# Patient Record
Sex: Female | Born: 1990 | Race: Black or African American | Hispanic: No | Marital: Single | Smoking: Current every day smoker
Health system: Southern US, Community
[De-identification: ages and names within clinical notes are randomized; demographics above are authoritative.]

## PROBLEM LIST (undated history)

## (undated) DIAGNOSIS — E669 Obesity, unspecified: Secondary | ICD-10-CM

## (undated) DIAGNOSIS — F1910 Other psychoactive substance abuse, uncomplicated: Secondary | ICD-10-CM

## (undated) HISTORY — DX: Other psychoactive substance abuse, uncomplicated: F19.10

---
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
## 2016-12-14 LAB — HM PAP SMEAR: HM Pap smear: NEGATIVE

## 2017-01-09 ENCOUNTER — Emergency Department
Admission: EM | Admit: 2017-01-09 | Discharge: 2017-01-09 | Disposition: A | Discharge status: Home / Self Care | Payer: Medicaid Other | Attending: Emergency Medicine | Admitting: Emergency Medicine

## 2017-01-09 ENCOUNTER — Encounter: Payer: Self-pay | Admitting: Emergency Medicine

## 2017-01-09 DIAGNOSIS — J039 Acute tonsillitis, unspecified: Secondary | ICD-10-CM | POA: Diagnosis not present

## 2017-01-09 DIAGNOSIS — H1131 Conjunctival hemorrhage, right eye: Secondary | ICD-10-CM | POA: Insufficient documentation

## 2017-01-09 DIAGNOSIS — F17210 Nicotine dependence, cigarettes, uncomplicated: Secondary | ICD-10-CM | POA: Diagnosis not present

## 2017-01-09 DIAGNOSIS — J029 Acute pharyngitis, unspecified: Secondary | ICD-10-CM | POA: Diagnosis present

## 2017-01-09 LAB — POCT RAPID STREP A: Streptococcus, Group A Screen (Direct): NEGATIVE

## 2017-01-09 MED ORDER — DIPHENHYDRAMINE HCL 25 MG PO CAPS: 25.0000 mg | ORAL_CAPSULE | Freq: Three times a day (TID) | ORAL | 0 refills | Status: DC | PRN

## 2017-01-09 MED ORDER — AMOXICILLIN 500 MG PO CAPS: 500.0000 mg | ORAL_CAPSULE | Freq: Three times a day (TID) | ORAL | 0 refills | Status: DC

## 2017-01-09 NOTE — ED Provider Notes (Signed)
[facility] Emergency Department Provider Note   ____________________________________________   None    (approximate)  I have reviewed the triage vital signs and the nursing notes.   HISTORY  Chief Complaint Sore Throat    HPI Bonnie Rasmussen is a 26 y.o. female patient complaining of sore throat for 3-4 days. Patient state pain with swallowing. Patient also states productive cough with green and yellow mucus. Patient is 12 weeks' gestation. Patient has a history of hypertrophic tonsils and adenoids. Patient rates the pain as a 10 over 10. No palliative measures taken for her complaint. History reviewed. No pertinent past medical history.  There are no active problems to display for this patient.   History reviewed. No pertinent surgical history.  Prior to Admission medications   Medication Sig Start Date End Date Taking? Authorizing Provider  amoxicillin (AMOXIL) 500 MG capsule Take 1 capsule (500 mg total) by mouth 3 (three) times daily. 01/09/17   Joni ReiningSmith, Ronald K, PA-C  diphenhydrAMINE (BENADRYL) 25 mg capsule Take 1 capsule (25 mg total) by mouth every 8 (eight) hours as needed. 01/09/17 01/09/18  Joni ReiningSmith, Ronald K, PA-C    Allergies Patient has no known allergies.  No family history on file.  Social History Social History  Substance Use Topics  . Smoking status: Current Every Day Smoker    Packs/day: 0.20    Types: Cigarettes  . Smokeless tobacco: Never Used  . Alcohol use No    Review of Systems Constitutional: No fever/chills Eyes: No visual changes. ENT: Sore throat  Cardiovascular: Denies chest pain. Respiratory: Denies shortness of breath. Productive cough Gastrointestinal: No abdominal pain.  No nausea, no vomiting.  No diarrhea.  No constipation. Genitourinary: Negative for dysuria. Musculoskeletal: Negative for back pain. Skin: Negative for rash. Neurological: Negative for headaches, focal weakness or  numbness.   ____________________________________________   PHYSICAL EXAM:  VITAL SIGNS: ED Triage Vitals  Enc Vitals Group     BP 01/09/17 1029 128/76     Pulse Rate 01/09/17 1029 87     Resp 01/09/17 1029 18     Temp 01/09/17 1029 98.4 F (36.9 C)     Temp Source 01/09/17 1029 Oral     SpO2 01/09/17 1029 96 %     Weight 01/09/17 1017 282 lb (127.9 kg)     Height 01/09/17 1017 5\' 5"  (1.651 m)     Head Circumference --      Peak Flow --      Pain Score 01/09/17 1017 10     Pain Loc --      Pain Edu? --      Excl. in GC? --     Constitutional: Alert and oriented. Well appearing and in no acute distress. Eyes: Redness right eye. PERRL. EOMI. Head: Atraumatic. Nose: No congestion/rhinnorhea. Mouth/Throat: Mucous membranes are moist.  Oropharynx non-erythematous. Edematous bilateral tonsils with exudate. Neck: No stridor.  No cervical spine tenderness to palpation.* Hematological/Lymphatic/Immunilogical: Bilateral cervical lymphadenopathy. Cardiovascular: Normal rate, regular rhythm. Grossly normal heart sounds.  Good peripheral circulation. Respiratory: Normal respiratory effort.  No retractions. Lungs CTAB. Neurologic:  Normal speech and language. No gross focal neurologic deficits are appreciated. No gait instability. Skin:  Skin is warm, dry and intact. No rash noted. Psychiatric: Mood and affect are normal. Speech and behavior are normal.  ____________________________________________   LABS (all labs ordered are listed, but only abnormal results are displayed)  Labs Reviewed  POCT RAPID STREP A   ____________________________________________  EKG  ____________________________________________  RADIOLOGY  No results found.  ____________________________________________   PROCEDURES  Procedure(s) performed: None  Procedures  Critical Care performed: No  ____________________________________________   INITIAL IMPRESSION / ASSESSMENT AND PLAN / ED  COURSE  Pertinent labs & imaging results that were available during my care of the patient were reviewed by me and considered in my medical decision making (see chart for details).  Tonsillitis and conjunctiva hemorrhaging of the right. She given discharge care instructions and advised follow-up PCP if no improvement 3-5 days.      ____________________________________________   FINAL CLINICAL IMPRESSION(S) / ED DIAGNOSES  Final diagnoses:  Tonsillitis  Conjunctival hemorrhage of right eye      NEW MEDICATIONS STARTED DURING THIS VISIT:  Discharge Medication List as of 01/09/2017 11:42 AM    START taking these medications   Details  amoxicillin (AMOXIL) 500 MG capsule Take 1 capsule (500 mg total) by mouth 3 (three) times daily., Starting Mon 01/09/2017, Print    diphenhydrAMINE (BENADRYL) 25 mg capsule Take 1 capsule (25 mg total) by mouth every 8 (eight) hours as needed., Starting Mon 01/09/2017, Until Tue 01/09/2018, Print         Note:  This document was prepared using Dragon voice recognition software and may include unintentional dictation errors.    Joni Reining, PA-C 01/09/17 1144    Rockne Menghini, MD 01/09/17 [phone]

## 2017-01-09 NOTE — ED Triage Notes (Signed)
Patient presents to the ED with sore throat x 3-4 days.  Patient reports pain with swallowing.  Patient reports productive cough with green/yellow mucous.  Patient is approx. [redacted] weeks pregnant.

## 2017-01-09 NOTE — ED Notes (Addendum)
See triage note   States she developed sore throat 3-4 days ago pain increases with swallowing  Occasional cough.  Right eye red irration. throat is red and swollen

## 2017-05-03 LAB — HM HIV SCREENING LAB: HM HIV Screening: NEGATIVE

## 2017-06-28 DIAGNOSIS — Z915 Personal history of self-harm: Secondary | ICD-10-CM | POA: Insufficient documentation

## 2017-06-28 DIAGNOSIS — Z9151 Personal history of suicidal behavior: Secondary | ICD-10-CM | POA: Insufficient documentation

## 2017-11-04 ENCOUNTER — Inpatient Hospital Stay: Admit: 2017-11-04 | Payer: Self-pay

## 2017-11-04 ENCOUNTER — Ambulatory Visit
Admission: EM | Admit: 2017-11-04 | Discharge: 2017-11-04 | Disposition: A | Discharge status: Home / Self Care | Payer: Self-pay | Attending: Family Medicine | Admitting: Family Medicine

## 2017-11-04 ENCOUNTER — Other Ambulatory Visit: Payer: Self-pay

## 2017-11-04 DIAGNOSIS — R1013 Epigastric pain: Secondary | ICD-10-CM

## 2017-11-04 DIAGNOSIS — R1011 Right upper quadrant pain: Secondary | ICD-10-CM

## 2017-11-04 HISTORY — DX: Obesity, unspecified: E66.9

## 2017-11-04 LAB — CBC WITH DIFFERENTIAL/PLATELET
Basophils Absolute: 0.1 10*3/uL (ref 0–0.1)
Basophils Relative: 1 %
Eosinophils Absolute: 0.3 10*3/uL (ref 0–0.7)
Eosinophils Relative: 2 %
HCT: 40.9 % (ref 35.0–47.0)
Hemoglobin: 13.6 g/dL (ref 12.0–16.0)
Lymphocytes Relative: 21 %
Lymphs Abs: 2.2 10*3/uL (ref 1.0–3.6)
MCH: 26.7 pg (ref 26.0–34.0)
MCHC: 33.3 g/dL (ref 32.0–36.0)
MCV: 80.2 fL (ref 80.0–100.0)
Monocytes Absolute: 0.8 10*3/uL (ref 0.2–0.9)
Monocytes Relative: 7 %
Neutro Abs: 7.2 10*3/uL — ABNORMAL HIGH (ref 1.4–6.5)
Neutrophils Relative %: 69 %
Platelets: 422 10*3/uL (ref 150–440)
RBC: 5.1 MIL/uL (ref 3.80–5.20)
RDW: 14.7 % — ABNORMAL HIGH (ref 11.5–14.5)
WBC: 10.6 10*3/uL (ref 3.6–11.0)

## 2017-11-04 LAB — BASIC METABOLIC PANEL
Anion gap: 8 (ref 5–15)
BUN: 18 mg/dL (ref 6–20)
CO2: 22 mmol/L (ref 22–32)
Calcium: 9 mg/dL (ref 8.9–10.3)
Chloride: 106 mmol/L (ref 101–111)
Creatinine, Ser: 0.74 mg/dL (ref 0.44–1.00)
GFR calc Af Amer: >60 mL/min (ref >60)
GFR calc non Af Amer: >60 mL/min (ref >60)
Glucose, Bld: 99 mg/dL (ref 65–99)
Potassium: 3.9 mmol/L (ref 3.5–5.1)
Sodium: 136 mmol/L (ref 135–145)

## 2017-11-04 LAB — LIPASE, BLOOD: Lipase: 26 U/L (ref 11–51)

## 2017-11-04 NOTE — ED Provider Notes (Addendum)
[facility] ____________________________________________  Time seen: Approximately 12:53 PM  I have reviewed the triage vital signs and the nursing notes.   HISTORY  Chief Complaint Abdominal Pain  HPI Bonnie Rasmussen is a 27 y.o. female presented for evaluation of upper abdominal pain that has been present intermittently for the last several years.  States after having her child approximately 3 and half months ago pain seems to be more frequent, and reports since last night pain has been more constant and increased in severity.  Patient states she has not noticed a pattern to the pain.  States food or lack of food does not trigger.  States cold water occasionally does help with the pain but otherwise no changes.  States pain worsened at 2 AM this morning that woke her up.  Denies any pain radiation, vomiting, diarrhea or nausea.  Overall continues to eat and drink well.  Denies accompanying fevers, sore throat, rash, skin changes or trauma.  Denies ever having this evaluated previously.No alleviating measures attempted prior to arrival.   Reports does have some nasal congestion and drainage as well that is present and consistent with her normal seasonal allergies.  Denies chest pain, shortness of breath, vaginal discharge, dysuria, extremity pain, extremity swelling or rash. Denies recent sickness. Denies recent antibiotic use.   Department, [facility]: PCP No LMP recorded. Patient has had an injection. Depo. Denies pregnancy.   Past Medical History:  Diagnosis Date  . Obese     There are no active problems to display for this patient.   Past Surgical History:  Procedure Laterality Date  . CESAREAN SECTION       No current facility-administered medications for this encounter.   Current Outpatient Medications:  .  medroxyPROGESTERone (DEPO-PROVERA) 150 MG/ML injection, Inject 150 mg into the muscle every 3 (three) months., Disp: , Rfl:  .   medroxyPROGESTERone (DEPO-PROVERA) 400 MG/ML SUSP injection, Inject into the muscle once., Disp: , Rfl:   Allergies Patient has no known allergies.  History reviewed. No pertinent family history.  Social History Social History   Tobacco Use  . Smoking status: Current Every Day Smoker    Packs/day: 0.20    Types: Cigarettes  . Smokeless tobacco: Never Used  Substance Use Topics  . Alcohol use: No  . Drug use: No    Review of Systems Constitutional: No fever/chills Cardiovascular: Denies chest pain. Respiratory: Denies shortness of breath. Gastrointestinal: As above. No nausea, no vomiting.  No diarrhea.  No constipation. Genitourinary: Negative for dysuria. Musculoskeletal: Negative for back pain. Skin: Negative for rash.   ____________________________________________   PHYSICAL EXAM:  VITAL SIGNS: ED Triage Vitals  Enc Vitals Group     BP 11/04/17 1156 (!) 143/109     Pulse Rate 11/04/17 1156 72     Resp 11/04/17 1156 20     Temp 11/04/17 1156 98.3 F (36.8 C)     Temp Source 11/04/17 1156 Oral     SpO2 11/04/17 1156 100 %     Weight 11/04/17 1200 280 lb (127 kg)     Height 11/04/17 1200 5\' 5"  (1.651 m)     Head Circumference --      Peak Flow --      Pain Score 11/04/17 1200 8     Pain Loc --      Pain Edu? --      Excl. in GC? --     Constitutional: Alert and oriented. Well appearing and in no acute distress.  ENT      Head: Normocephalic and atraumatic.      Mouth/Throat: Mucous membranes are moist.Oropharynx non-erythematous. Cardiovascular: Normal rate, regular rhythm. Grossly normal heart sounds.  Good peripheral circulation. Respiratory: Normal respiratory effort without tachypnea nor retractions. Breath sounds are clear and equal bilaterally. No wheezes, rales, rhonchi. Gastrointestinal: Normal Bowel sounds. Obese abdomen. Mild to moderate tenderness mid epigastric and right epigastric and right upper quadrant.  No CVA tenderness. Musculoskeletal:   No midline cervical, thoracic or lumbar tenderness to palpation. Neurologic:  Normal speech and language. Speech is normal. No gait instability.  Skin:  Skin is warm, dry and intact. No rash noted. Psychiatric: Mood and affect are normal. Speech and behavior are normal. Patient exhibits appropriate insight and judgment   ___________________________________________   LABS (all labs ordered are listed, but only abnormal results are displayed)  Labs Reviewed  CBC WITH DIFFERENTIAL/PLATELET - Abnormal; Notable for the following components:      Result Value   RDW 14.7 (*)    Neutro Abs 7.2 (*)    All other components within normal limits  BASIC METABOLIC PANEL  LIPASE, BLOOD   ____________________________________________   PROCEDURES Procedures   INITIAL IMPRESSION / ASSESSMENT AND PLAN / ED COURSE  Pertinent labs & imaging results that were available during my care of the patient were reviewed by me and considered in my medical decision making (see chart for details).  Overall well-appearing patient.  No acute distress. Patient has had ongoing abdominal pain for years, worsened over the last few months and worsened again over last night.  Discussed multiple differentials with patient including gallstones, cholecystitis, gastritis, GERD, H. Pylori, and discuss further evaluation of these.  Will evaluate CBC, BMP and lipase. Also recommend outpatient ultrasound.  Encourage patient to follow-up closely with primary care as well as further follow-up with gastroenterology.  Laboratory results reviewed, overall unremarkable.  Will obtain outpatient ultrasound at this time.  Labs reviewed.  Patient was then directed by nursing staff to go directly to outpatient ultrasound n.p.o.  However 2 hours after called ultrasound and still patient had not shown up.  Try to reach patient by cell phone listed, unable to contact patient, left message on voicemail.Discussed follow up with Primary care physician  this week. Discussed follow up and return parameters including no resolution or any worsening concerns. Patient verbalized understanding and agreed to plan.   4:15pm spoke with ultrasound, patient has still not shown up for ultrasound. Additional contact information given.   Received call last night 7pm, from ultrasound, patient did not show up for ultrasound as had been directed, ultrasound canceled.  ________________________________________   FINAL CLINICAL IMPRESSION(S) / ED DIAGNOSES  Final diagnoses:  Epigastric pain  Right upper quadrant abdominal pain     ED Discharge Orders        Ordered    US Abdomen Limited     11/04/17 1325       Note: This dictation was prepared with Dragon dictation along with smaller phrase technology. Any transcriptional errors that result from this process are unintentional.         Renford DillsMiller, [place], NP 11/04/17 1620    Renford DillsMiller, [place], NP 11/05/17 [phone]

## 2017-11-04 NOTE — Discharge Instructions (Signed)
Ultrasound today as discussed. Rest. Drink plenty of fluids.   Follow up with gastroenterology this week.   Follow up with your primary care physician this week as needed. Return to Urgent care for new or worsening concerns.

## 2017-11-04 NOTE — ED Triage Notes (Signed)
Pt with pain in epigastric area feels like a cramp. Has been happening for the past few years. First started out infrequently but has been becoming more frequent. She reports it got worse after childbirth in December. Pain 8/10

## 2019-04-04 DIAGNOSIS — Z9151 Personal history of suicidal behavior: Secondary | ICD-10-CM

## 2019-04-04 DIAGNOSIS — Z915 Personal history of self-harm: Secondary | ICD-10-CM

## 2019-04-05 ENCOUNTER — Encounter: Payer: Self-pay | Admitting: Family Medicine

## 2019-04-05 ENCOUNTER — Other Ambulatory Visit: Payer: Self-pay

## 2019-04-05 ENCOUNTER — Ambulatory Visit (LOCAL_COMMUNITY_HEALTH_CENTER): Payer: Medicaid Other | Admitting: Family Medicine

## 2019-04-05 VITALS — Ht 65.0 in | Wt 342.2 lb

## 2019-04-05 DIAGNOSIS — Z3009 Encounter for other general counseling and advice on contraception: Secondary | ICD-10-CM

## 2019-04-05 LAB — PREGNANCY, URINE: Preg Test, Ur: POSITIVE — AB

## 2019-04-05 NOTE — Progress Notes (Signed)
Here today to discuss Nexplanon for birth control. Hal Morales, RN PT positive today. Patient states "I can't have it." Declines PNV or +PT packet. Accepts resource sheet. Hal Morales, RN

## 2019-04-07 NOTE — Progress Notes (Signed)
Patient was not seen by provider and visit was conducted as a PT  RN testing visit. The patient is no eligible for contraceptive options at this point.  Please see RN note.

## 2019-11-01 ENCOUNTER — Observation Stay
Admission: EM | Admit: 2019-11-01 | Discharge: 2019-11-01 | Disposition: A | Discharge status: Home / Self Care | Payer: Medicaid Other | Attending: Obstetrics & Gynecology | Admitting: Obstetrics & Gynecology

## 2019-11-01 ENCOUNTER — Other Ambulatory Visit: Payer: Self-pay

## 2019-11-01 DIAGNOSIS — F17210 Nicotine dependence, cigarettes, uncomplicated: Secondary | ICD-10-CM | POA: Insufficient documentation

## 2019-11-01 DIAGNOSIS — O99333 Smoking (tobacco) complicating pregnancy, third trimester: Secondary | ICD-10-CM | POA: Insufficient documentation

## 2019-11-01 DIAGNOSIS — O321XX0 Maternal care for breech presentation, not applicable or unspecified: Secondary | ICD-10-CM | POA: Insufficient documentation

## 2019-11-01 DIAGNOSIS — O471 False labor at or after 37 completed weeks of gestation: Secondary | ICD-10-CM | POA: Diagnosis not present

## 2019-11-01 DIAGNOSIS — O368190 Decreased fetal movements, unspecified trimester, not applicable or unspecified: Secondary | ICD-10-CM | POA: Diagnosis present

## 2019-11-01 DIAGNOSIS — O368130 Decreased fetal movements, third trimester, not applicable or unspecified: Secondary | ICD-10-CM | POA: Diagnosis not present

## 2019-11-01 DIAGNOSIS — Z7982 Long term (current) use of aspirin: Secondary | ICD-10-CM | POA: Diagnosis not present

## 2019-11-01 DIAGNOSIS — Z3A37 37 weeks gestation of pregnancy: Secondary | ICD-10-CM | POA: Insufficient documentation

## 2019-11-01 DIAGNOSIS — O3422 Maternal care for cesarean scar defect (isthmocele): Secondary | ICD-10-CM | POA: Insufficient documentation

## 2019-11-01 DIAGNOSIS — Z79899 Other long term (current) drug therapy: Secondary | ICD-10-CM | POA: Diagnosis not present

## 2019-11-01 DIAGNOSIS — O99213 Obesity complicating pregnancy, third trimester: Secondary | ICD-10-CM | POA: Insufficient documentation

## 2019-11-01 MED ORDER — ACETAMINOPHEN 500 MG PO TABS
ORAL_TABLET | ORAL | Status: AC
  Filled 2019-11-01: qty 2

## 2019-11-01 MED ORDER — ACETAMINOPHEN 325 MG PO TABS
650.0000 mg | ORAL_TABLET | ORAL | Status: DC | PRN
  Administered 2019-11-01: 650 mg via ORAL
  Filled 2019-11-01: qty 2

## 2019-11-01 MED ORDER — ONDANSETRON HCL 4 MG/2ML IJ SOLN: 4.0000 mg | Freq: Four times a day (QID) | INTRAMUSCULAR | Status: DC | PRN

## 2019-11-01 MED ORDER — LIDOCAINE HCL (PF) 1 % IJ SOLN: 30.0000 mL | INTRAMUSCULAR | Status: DC | PRN

## 2019-11-01 NOTE — H&P (Signed)
Obstetrics Admission History & Physical   CC: DFM  HPI:  29 y.o. G2P1001 @ [redacted]w[redacted]d (11/17/2019, by Last Menstrual Period). Admitted on 11/01/2019:   Patient Active Problem List   Diagnosis Date Noted  . Decreased fetal movement 11/01/2019    Presents for decreased fetal movement but also lower abdominal contraction pains every 6-7 minutes.  Denies VB or ROM.  Known breech, palnning for CS 11/02/19 at 39 weeks.  Prior CS 2 years ago.   Prenatal care at: at another place : [facility] Pregnancy complicated by Obesity, Prior CS..  ROS: A review of systems was performed and negative, except as stated in the above HPI. PMHx:  Past Medical History:  Diagnosis Date  . Drug abuse (HCC)   . Obese    PSHx:  Past Surgical History:  Procedure Laterality Date  . CESAREAN SECTION     Medications:  Medications Prior to Admission  Medication Sig Dispense Refill Last Dose  . aspirin 81 MG chewable tablet Chew by mouth daily.   Past Week at Unknown time  . Folic Acid-Vit B6-Vit B12 (FOLBEE) 2.5-25-1 MG TABS tablet Take 1 tablet by mouth daily.   Past Week at Unknown time  . Prenatal Vit-Fe Fumarate-FA (PRENATAL MULTIVITAMIN) TABS tablet Take 1 tablet by mouth daily at 12 noon.   Past Week at Unknown time  . medroxyPROGESTERone (DEPO-PROVERA) 150 MG/ML injection Inject 150 mg into the muscle every 3 (three) months.   Unknown at Unknown time  . medroxyPROGESTERone (DEPO-PROVERA) 400 MG/ML SUSP injection Inject into the muscle once.   Unknown at Unknown time   Allergies: has No Known Allergies. OBHx:  OB History  Gravida Para Term Preterm AB Living  2 1 1     1   SAB TAB Ectopic Multiple Live Births          1    # Outcome Date GA Lbr Len/2nd Weight Sex Delivery Anes PTL Lv  2 Current           1 Term 07/24/17 [redacted]w[redacted]d   M CS-LTranv   LIV   [redacted]w[redacted]d except as detailed in HPI.CBJ:[id number]  No family history of birth defects. Soc Hx: Alcohol: none and Recreational drug use: none  Objective:    Vitals:   11/01/19 1900  BP: 129/62  Pulse: 72  Resp: 18  Temp: 98.2 F (36.8 C)   Constitutional: Well nourished, well developed female in no acute distress.  HEENT: normal Skin: Warm and dry.  Cardiovascular:Regular rate and rhythm.   Extremity: trace to 1+ bilateral pedal edema Respiratory: Clear to auscultation bilateral. Normal respiratory effort Abdomen: gravid, ND, FHT present, mild tenderness on exam Back: no CVAT Neuro: DTRs 2+, Cranial nerves grossly intact Psych: Alert and Oriented x3. No memory deficits. Normal mood and affect.  MS: normal gait, normal bilateral lower extremity ROM/strength/stability.  Pelvic exam: is limited by body habitus EGBUS: within normal limits Vagina: within normal limits and with normal mucosa Cervix: closed and high Uterus: Diffcult to trace but self reported at 6-7 minutes apart  Adnexa: not evaluated  EFM:FHR: 140 bpm, variability: moderate,  accelerations:  Present,  decelerations:  Absent  A NST procedure was performed with FHR monitoring and a normal baseline established, appropriate time of 20-40 minutes of evaluation, and accels >2 seen w 15x15 characteristics.  Results show a REACTIVE NST.   ULTRASOUND REPORT  Location: [facility] Date of Service: 11/01/2019   Indications:Breech, DFM Findings:  Singleton intrauterine pregnancy is visualized with FHR at 140 BPM.  Fetal presentation is Breech. Pilar Plate) Placenta: fundal, anterior. Grade: 1 AFI: Adequate  Impression: 1. [redacted]w[redacted]d Viable Singleton Intrauterine pregnancy dated by previously established criteria. 2. Pilar Plate Breech.   Recommendations: 1.Clinical correlation with the patient's History and Physical Exam. Hoyt Koch, MD  Assessment & Plan:   29 y.o. G2P1001 @ [redacted]w[redacted]d, Admitted on 11/01/2019:Decreased fetal movement, NST reassuring Labor sx's- likely false labor Monitor CS discussed if in labor    Barnett Applebaum, MD, [place], [place] 11/01/2019  8:44 PM

## 2019-11-01 NOTE — Discharge Summary (Signed)
Physician Discharge Summary  Patient ID: Bonnie Rasmussen MRN: [id number] DOB/AGE: 1991/02/19 29 y.o.  Admit date: 11/01/2019 Discharge date: 11/01/2019  Admission Diagnoses: Active Problems:   Decreased fetal movement  Discharge Diagnoses:  Active Problems:   Decreased fetal movement  Discharged Condition: good  Hospital Course:  Subjective:   Bonnie Rasmussen is a 29 y.o. female. She is at [redacted]w[redacted]d gestation. She has noted decreased fetal movement for the last 1 day.  She also reports lower abdominal pains and cts but denies bleeding and nausea. Her pregnancy has been complicated by: obesity   ROS: A review of systems was performed and negative, except as stated in the above HPI.  OBGYN History: As per HPI. OB History    Gravida  2   Para  1   Term  1   Preterm      AB      Living  1     SAB      TAB      Ectopic      Multiple      Live Births  1            Past Medical History: Past Medical History:  Diagnosis Date  . Drug abuse (HCC)   . Obese     Past Surgical History: Past Surgical History:  Procedure Laterality Date  . CESAREAN SECTION      Family History:  Family History  Problem Relation Age of Onset  . Congestive Heart Failure Mother   . Diabetes Mother   . Anemia Mother   . Heart disease Father   . Diabetes Maternal Grandmother   . Asthma Half-Brother    She denies any female cancers, bleeding or blood clotting disorders.   Social History:  Social History   Socioeconomic History  . Marital status: Single    Spouse name: Not on file  . Number of children: Not on file  . Years of education: Not on file  . Highest education level: Not on file  Occupational History  . Not on file  Tobacco Use  . Smoking status: Current Every Day Smoker    Packs/day: 0.20    Types: Cigarettes  . Smokeless tobacco: Never Used  Substance and Sexual Activity  . Alcohol use: No  . Drug use: No  . Sexual activity: Not on file  Other Topics  Concern  . Not on file  Social History Narrative  . Not on file   Social Determinants of Health   Financial Resource Strain:   . Difficulty of Paying Living Expenses:   Food Insecurity:   . Worried About Programme researcher, broadcasting/film/video in the Last Year:   . Barista in the Last Year:   Transportation Needs:   . Freight forwarder (Medical):   Marland Kitchen Lack of Transportation (Non-Medical):   Physical Activity:   . Days of Exercise per Week:   . Minutes of Exercise per Session:   Stress:   . Feeling of Stress :   Social Connections:   . Frequency of Communication with Friends and Family:   . Frequency of Social Gatherings with Friends and Family:   . Attends Religious Services:   . Active Member of Clubs or Organizations:   . Attends Banker Meetings:   Marland Kitchen Marital Status:   Intimate Partner Violence:   . Fear of Current or Ex-Partner:   . Emotionally Abused:   Marland Kitchen Physically Abused:   . Sexually Abused:  Allergy: No Known Allergies  Medications:  No medications prior to admission.    Objective:   Vitals:   11/01/19 1900 11/01/19 2122  BP: 129/62 131/62  Pulse: 72 70  Resp: 18   Temp: 98.2 F (36.8 C) 98.1 F (36.7 C)   Constitutional: Well nourished, well developed female in no acute distress.  HEENT: normal Skin: Warm and dry.  Cardiovascular:Regular rate and rhythm.   Extremity: trace to 1+ bilateral pedal edema Respiratory: Clear to auscultation bilateral. Normal respiratory effort Abdomen: mild Back: no CVAT Neuro: DTRs 2+, Cranial nerves grossly intact Psych: Alert and Oriented x3. No memory deficits. Normal mood and affect.  MS: normal gait, normal bilateral lower extremity ROM/strength/stability.  Pelvic exam: is limited by body habitus EGBUS: within normal limits Vagina: within normal limits and with normal mucosa blood in the vault Cervix: closed, thick Uterus: Uterus demonstrates irritability pattern.  Adnexa: not evaluated  External  fetal monitoring: A NST procedure was performed with FHR monitoring and a normal baseline established, appropriate time of 20-40 minutes of evaluation, and accels >2 seen w 15x15 characteristics.  Results show a REACTIVE NST.   Ultrasound: see note    Assessment:   Pregnancy at [redacted]w[redacted]d with concerns for decreased fetal movement. Evaluation reveals: reassuring findings   reactive  Plan:    Home monitoring  f/u [facility] provider  CS as scheduled, or if labor ensues  Orders placed: Orders Placed This Encounter  Procedures  . Diet clear liquid Room service appropriate? Yes; Fluid consistency: Thin    Standing Status:   Standing    Number of Occurrences:   1    Order Specific Question:   Room service appropriate?    Answer:   Yes    Order Specific Question:   Fluid consistency:    Answer:   Thin  . Diet general  . Vitals signs per unit policy    Standing Status:   Standing    Number of Occurrences:   1  . Notify Physician    Standing Status:   Standing    Number of Occurrences:   1    Order Specific Question:   Notify Physician    Answer:   for vaginal bleeding    Order Specific Question:   Notify Physician    Answer:   for acute abdominal pain    Order Specific Question:   Notify Physician    Answer:   for temperature >/= 100.4 F    Order Specific Question:   Notify Physician    Answer:   for significant change in vital signs    Order Specific Question:   Notify Physician    Answer:   for non-reassuring fetal heart rate pattern    Order Specific Question:   Notify Physician    Answer:   for imminent delivery or failure to progress  . Fetal monitoring per unit policy    Standing Status:   Standing    Number of Occurrences:   1  . Activity as tolerated    Standing Status:   Standing    Number of Occurrences:   1  . Cervical Exam    Unless contraindicated, every 1-2 hours in active labor, or at nurse's discretion    Standing Status:   Standing    Number of Occurrences:   1  .  Measure blood pressure post delivery every 15 min x 1 hour then every 30 min x 1 hour    Standing Status:  Standing    Number of Occurrences:   1  . Fundal check post delivery every 15 min x 1 hour then every 30 min x 1 hour    Standing Status:   Standing    Number of Occurrences:   1  . Initiate Carrier Fluid Protocol    Standing Status:   Standing    Number of Occurrences:   1  . Initiate Oral Care Protocol    Standing Status:   Standing    Number of Occurrences:   1  . Increase activity slowly  . Call MD for:    Worsening contractions or pain; leakage of fluid; bleeding.  . Full code    Standing Status:   Standing    Number of Occurrences:   1  . Place in observation (patient's expected length of stay will be less than 2 midnights)    Standing Status:   Standing    Number of Occurrences:   1    Order Specific Question:   Hospital Area    Answer:   [facility] [[id number]]    Order Specific Question:   Level of Care    Answer:   Labor and Delivery [101]    Order Specific Question:   Covid Evaluation    Answer:   Asymptomatic Screening Protocol (No Symptoms)    Order Specific Question:   Diagnosis    Answer:   Decreased fetal movement [[id number]]    Order Specific Question:   Admitting Physician    Answer:   Nadara Mustard [[id number]]    Order Specific Question:   Attending Physician    Answer:   Nadara Mustard [[id number]]  . Discharge patient Discharge disposition: 01-Home or Self Care; Discharge patient date: 11/01/2019    Standing Status:   Standing    Number of Occurrences:   1    Order Specific Question:   Discharge disposition    Answer:   01-Home or Self Care [1]    Order Specific Question:   Discharge patient date    Answer:   11/01/2019    Patient expresses understanding of information provided and plan of care.       Consults: None  Significant Diagnostic Studies: A NST procedure was performed with FHR monitoring and a normal baseline established,  appropriate time of 20-40 minutes of evaluation, and accels >2 seen w 15x15 characteristics.  Results show a REACTIVE NST.     Treatments: none  Discharge Exam: Blood pressure 131/62, pulse 70, temperature 98.1 F (36.7 C), temperature source Oral, resp. rate 18, last menstrual period 02/10/2019, unknown if currently breastfeeding.   Disposition: Discharge disposition: 01-Home or Self Care       Discharge Instructions    Call MD for:   Complete by: As directed    Worsening contractions or pain; leakage of fluid; bleeding.   Diet general   Complete by: As directed    Increase activity slowly   Complete by: As directed      Allergies as of 11/01/2019   No Known Allergies     Medication List    STOP taking these medications   medroxyPROGESTERone 150 MG/ML injection Commonly known as: DEPO-PROVERA   medroxyPROGESTERone 400 MG/ML Susp injection Commonly known as: DEPO-PROVERA     TAKE these medications   aspirin 81 MG chewable tablet Chew by mouth daily.   Folic Acid-Vit B6-Vit B12 2.5-25-1 MG Tabs tablet Commonly known as: FOLBEE Take 1 tablet by mouth daily.  prenatal multivitamin Tabs tablet Take 1 tablet by mouth daily at 12 noon.        Signed: Letitia Libra 11/01/2019, 9:40 PM

## 2019-11-01 NOTE — Progress Notes (Signed)
Pt is a G2P1 and [redacted]w[redacted]d presenting to L&D with c/o decreased fetal movement. Pt states she has not felt baby move at all since early this morning. Pt also states "I have a cramping pressure in my bottom that won't go away" since 0800 this morning. Pt rates pain 4/10 on a 0-10 pain scale. Pt denies VB and LOF. Pt denies recent intercourse or alcohol/substance use. Monitors applied and assessing.

## 2019-11-01 NOTE — Progress Notes (Signed)
Discharge instructions reviewed with pt and pt verbalized understanding. Pt stable upon discharge. Pt denies any further needs at this time.

## 2019-12-30 ENCOUNTER — Ambulatory Visit
Admission: EM | Admit: 2019-12-30 | Discharge: 2019-12-30 | Disposition: A | Discharge status: Home / Self Care | Payer: Medicaid Other | Attending: Internal Medicine | Admitting: Internal Medicine

## 2019-12-30 ENCOUNTER — Encounter: Payer: Self-pay | Admitting: Emergency Medicine

## 2019-12-30 ENCOUNTER — Other Ambulatory Visit: Payer: Self-pay

## 2019-12-30 DIAGNOSIS — G510 Bell's palsy: Secondary | ICD-10-CM | POA: Insufficient documentation

## 2019-12-30 LAB — FIBRIN DERIVATIVES D-DIMER (ARMC ONLY): Fibrin derivatives D-dimer (ARMC): 248.06 ng/mL (FEU) (ref 0.00–499.00)

## 2019-12-30 MED ORDER — VALACYCLOVIR HCL 1 G PO TABS
1000.0000 mg | ORAL_TABLET | Freq: Three times a day (TID) | ORAL | 0 refills | Status: AC
Start: 2019-12-30 — End: 2020-01-09

## 2019-12-30 MED ORDER — PREDNISONE 10 MG (21) PO TBPK
ORAL_TABLET | Freq: Every day | ORAL | 0 refills | Status: DC
Start: 2019-12-30 — End: 2021-10-27

## 2019-12-30 NOTE — ED Provider Notes (Signed)
[facility]    CSN: [id number] Arrival date & time: 12/30/19  1205      History   Chief Complaint Chief Complaint  Patient presents with  . Facial Droop    HPI Bonnie Rasmussen is a 29 y.o. female. comes in with drooping on L face noted  2 days ago when she was trying to put lip gloss on and could not put her lips together and her L tongue is numb. Her boyfriends noticed something different happening to her L face. That same night( 5 days ago) she had severe HA and Ibuprofen did not help and still work up with it. The next day she took more Ibuprofen and HA was still there, but resolved by that night. Denies trouble speaking or thinkig.  She is a smoker.     Past Medical History:  Diagnosis Date  . Drug abuse ([facility])   . Obese     Patient Active Problem List   Diagnosis Date Noted  . Decreased fetal movement 11/01/2019  . History of suicide attempt 06/28/2017    Past Surgical History:  Procedure Laterality Date  . CESAREAN SECTION      OB History    Gravida  2   Para  1   Term  1   Preterm      AB      Living  1     SAB      TAB      Ectopic      Multiple      Live Births  1            Home Medications    Prior to Admission medications   Medication Sig Start Date End Date Taking? Authorizing Provider  predniSONE (STERAPRED UNI-PAK 21 TAB) 10 MG (21) TBPK tablet Take by mouth daily. Take 6 tabs by mouth daily  for 2 days, then 5 tabs for 2 days, then 4 tabs for 2 days, then 3 tabs for 2 days, 2 tabs for 2 days, then 1 tab by mouth daily for 2 days 12/30/19   Rodriguez-Southworth, Sunday Spillers, PA-C  valACYclovir (VALTREX) 1000 MG tablet Take 1 tablet (1,000 mg total) by mouth 3 (three) times daily for 10 days. 12/30/19 01/09/20  Rodriguez-Southworth, Sunday Spillers, PA-C    Family History Family History  Problem Relation Age of Onset  . Congestive Heart Failure Mother   . Diabetes Mother   . Anemia Mother   . Heart disease Father   . Stroke Father    . Diabetes Maternal Grandmother   . Asthma Half-Brother     Social History Social History   Tobacco Use  . Smoking status: Current Every Day Smoker    Packs/day: 0.20    Types: Cigarettes  . Smokeless tobacco: Never Used  Substance Use Topics  . Alcohol use: No  . Drug use: No     Allergies   Patient has no known allergies.   Review of Systems Review of Systems  Constitutional: Negative for activity change, appetite change, chills, diaphoresis, fatigue and fever.  HENT: Negative for congestion, ear discharge, ear pain, facial swelling, mouth sores, postnasal drip, rhinorrhea, sinus pain, sore throat and tinnitus.   Eyes: Negative for discharge.  Respiratory: Negative for cough and shortness of breath.   Cardiovascular: Negative for chest pain and leg swelling.  Musculoskeletal: Negative for gait problem.  Skin: Negative for rash and wound.  Allergic/Immunologic: Negative for environmental allergies.  Neurological: Positive for numbness and headaches. Negative for  dizziness, tremors, syncope, speech difficulty and light-headedness.  Hematological: Negative for adenopathy.     Physical Exam Triage Vital Signs ED Triage Vitals  Enc Vitals Group     BP 12/30/19 1210 140/68     Pulse Rate 12/30/19 1210 67     Resp 12/30/19 1210 18     Temp 12/30/19 1210 98.2 F (36.8 C)     Temp Source 12/30/19 1210 Oral     SpO2 12/30/19 1210 99 %     Weight 12/30/19 1210 (!) 328 lb (148.8 kg)     Height 12/30/19 1210 5' 4.5" (1.638 m)     Head Circumference --      Peak Flow --      Pain Score 12/30/19 1209 3     Pain Loc --      Pain Edu? --      Excl. in GC? --    No data found.  Updated Vital Signs BP 140/68 (BP Location: Left Arm)   Pulse 67   Temp 98.2 F (36.8 C) (Oral)   Resp 18   Ht 5' 4.5" (1.638 m)   Wt (!) 328 lb (148.8 kg)   LMP 02/10/2019 Comment: Normal  SpO2 99%   Breastfeeding No   BMI 55.43 kg/m   Visual Acuity Right Eye Distance:   Left Eye  Distance:   Bilateral Distance:    Right Eye Near:   Left Eye Near:    Bilateral Near:     Physical Exam Physical Exam Vitals signs and nursing note reviewed.  Constitutional:      General: He is not in acute distress.    Appearance: He is well-developed and normal weight. He is not ill-appearing, toxic-appearing or diaphoretic.  HENT: L lip and brow are droopy and does not not move symmetrically like the other side of her face    Head: Normocephalic.  Eyes: she is unable to close her L eye lid fully.     Extraocular Movements: Extraocular movements intact.     Pupils: Pupils are equal, round, and reactive to light.  Neck: no carotid bruits heard    Musculoskeletal: Neck supple. No neck rigidity.     Meningeal: Brudzinski's sign absent.  Cardiovascular:     Rate and Rhythm: Normal rate and regular rhythm.     Heart sounds: No murmur.  Pulmonary:     Effort: Pulmonary effort is normal.     Breath sounds: Normal breath sounds. No wheezing, rhonchi or rales.  Abdominal:     General: Bowel sounds are normal.     Palpations: Abdomen is soft. There is no mass.     Tenderness: There is no abdominal tenderness. There is no guarding.  Musculoskeletal: Normal range of motion.  Lymphadenopathy:     Cervical: No cervical adenopathy.  Skin:    General: Skin is warm and dry.  Neurological:     Mental Status: He is alert.     Cranial Nerves: No cranial nerve deficit or facial asymmetry.     Sensory: No sensory deficit.     Motor: No weakness.     Coordination: Romberg sign negative. Coordination normal.     Gait: Gait normal.     Deep Tendon Reflexes: Reflexes normal.     Comments: Normal Romberg, propioception, finger to nose, tandem gait.   Psychiatric:        Mood and Affect: Mood normal.        Speech: Speech normal.  Behavior: Behavior normal.   UC Treatments / Results  Labs (all labs ordered are listed, but only abnormal results are displayed) Labs Reviewed  FIBRIN  DERIVATIVES D-DIMER ([facility] ONLY)  D dimer was normal.   EKG   Radiology No results found.  Procedures Procedures (including critical care time)  Medications Ordered in UC Medications - No data to display  Initial Impression / Assessment and Plan / UC Course  I have reviewed the triage vital signs and the nursing notes. I consulted this case with Dr Adriana Simas. Pt placed on Valtrex and Prednisone as noted. Advised to get paper tape to take here eye at bed time and get saline eye gel to place in her eye at bed time.  See instructions.  Final Clinical Impressions(s) / UC Diagnoses   Final diagnoses:  Bell's palsy     Discharge Instructions     Your blood test called D- dimer is negative, so this helps [place] be more assured you are not having a stroke. I am placing you on medicine to help your symptoms resolve. Take all of it Follow up with your family Dr in case you dont get better or have complications.     ED Prescriptions    Medication Sig Dispense Auth. Provider   predniSONE (STERAPRED UNI-PAK 21 TAB) 10 MG (21) TBPK tablet Take by mouth daily. Take 6 tabs by mouth daily  for 2 days, then 5 tabs for 2 days, then 4 tabs for 2 days, then 3 tabs for 2 days, 2 tabs for 2 days, then 1 tab by mouth daily for 2 days 42 tablet Rodriguez-Southworth, Nettie Elm, PA-C   valACYclovir (VALTREX) 1000 MG tablet Take 1 tablet (1,000 mg total) by mouth 3 (three) times daily for 10 days. 30 tablet Rodriguez-Southworth, Nettie Elm, PA-C     PDMP not reviewed this encounter.   Garey Ham, [place] 12/30/19 2036

## 2019-12-30 NOTE — ED Triage Notes (Signed)
Pt c/o left sided facial drooping. Started about 2 days ago. She denies dizziness or weakness anywhere else in the body, or speech problems. She does mention she has a headache.

## 2019-12-30 NOTE — Discharge Instructions (Signed)
Your blood test called D- dimer is negative, so this helps [place] be more assured you are not having a stroke. I am placing you on medicine to help your symptoms resolve. Take all of it Follow up with your family Dr in case you dont get better or have complications.

## 2020-06-01 ENCOUNTER — Other Ambulatory Visit: Payer: Self-pay | Admitting: Gerontology

## 2020-06-01 DIAGNOSIS — R1013 Epigastric pain: Secondary | ICD-10-CM

## 2021-10-27 ENCOUNTER — Encounter: Payer: Self-pay | Admitting: Emergency Medicine

## 2021-10-27 ENCOUNTER — Ambulatory Visit (INDEPENDENT_AMBULATORY_CARE_PROVIDER_SITE_OTHER): Payer: Medicaid Other

## 2021-10-27 ENCOUNTER — Ambulatory Visit
Admission: EM | Admit: 2021-10-27 | Discharge: 2021-10-27 | Disposition: A | Discharge status: Home / Self Care | Payer: Medicaid Other | Attending: Physician Assistant | Admitting: Physician Assistant

## 2021-10-27 ENCOUNTER — Other Ambulatory Visit: Payer: Self-pay

## 2021-10-27 DIAGNOSIS — S93401A Sprain of unspecified ligament of right ankle, initial encounter: Secondary | ICD-10-CM

## 2021-10-27 DIAGNOSIS — M25571 Pain in right ankle and joints of right foot: Secondary | ICD-10-CM

## 2021-10-27 DIAGNOSIS — M79671 Pain in right foot: Secondary | ICD-10-CM

## 2021-10-27 DIAGNOSIS — W1842XA Slipping, tripping and stumbling without falling due to stepping into hole or opening, initial encounter: Secondary | ICD-10-CM | POA: Diagnosis not present

## 2021-10-27 DIAGNOSIS — W19XXXA Unspecified fall, initial encounter: Secondary | ICD-10-CM | POA: Diagnosis not present

## 2021-10-27 MED ORDER — IBUPROFEN 800 MG PO TABS: 800.0000 mg | ORAL_TABLET | Freq: Three times a day (TID) | ORAL | 0 refills | Status: DC | PRN

## 2021-10-27 NOTE — Discharge Instructions (Addendum)
SPRAIN: X-rays do not show any evidence of fracture.  Stressed avoiding painful activities . Reviewed RICE guidelines. Use medications as directed, including NSAIDs. If no NSAIDs have been prescribed for you today, you may take Aleve or Motrin over the counter. May use Tylenol in between doses of NSAIDs.  If no improvement in the next 1-2 weeks, f/u with PCP or return to our office for reexamination, and please feel free to call or return at any time for any questions or concerns you may have and we will be happy to help you!     ?

## 2021-10-27 NOTE — ED Provider Notes (Signed)
?[facility] ? ? ? ?CSN: [id number] ?Arrival date & time: 10/27/21  7846 ? ? ?  ? ?History   ?Chief Complaint ?Chief Complaint  ?Patient presents with  ? Ankle Pain  ?  right  ? ? ?HPI ?Bonnie Rasmussen is a 31 y.o. female presenting for right ankle and foot pain.  Patient says she was walking in her yard yesterday and stepped in a hole.  She states she twisted the ankle and fell.  Reports immediate swelling.  Patient reports unable to bear weight.  Her partner had to get a wheelchair to bring her in here today.  She has applied ice but not taking anything for pain relief. Reporting some numbness that is generalized.  Pain with movement.  No other injuries reported.  No history of fracture or major injury of this extremity.  No other complaints. ? ?HPI ? ?Past Medical History:  ?Diagnosis Date  ? Drug abuse (HCC)   ? Obese   ? ? ?Patient Active Problem List  ? Diagnosis Date Noted  ? Decreased fetal movement 11/01/2019  ? History of suicide attempt 06/28/2017  ? ? ?Past Surgical History:  ?Procedure Laterality Date  ? CESAREAN SECTION    ? ? ?OB History   ? ? Gravida  ?2  ? Para  ?1  ? Term  ?1  ? Preterm  ?   ? AB  ?   ? Living  ?1  ?  ? ? SAB  ?   ? IAB  ?   ? Ectopic  ?   ? Multiple  ?   ? Live Births  ?1  ?   ?  ?  ? ? ? ?Home Medications   ? ?Prior to Admission medications   ?Medication Sig Start Date End Date Taking? Authorizing Provider  ?ibuprofen (ADVIL) 800 MG tablet Take 1 tablet (800 mg total) by mouth every 8 (eight) hours as needed. 10/27/21  Yes Shirlee Latch, PA-C  ? ? ?Family History ?Family History  ?Problem Relation Age of Onset  ? Congestive Heart Failure Mother   ? Diabetes Mother   ? Anemia Mother   ? Heart disease Father   ? Stroke Father   ? Diabetes Maternal Grandmother   ? Asthma Half-Brother   ? ? ?Social History ?Social History  ? ?Tobacco Use  ? Smoking status: Every Day  ?  Packs/day: 0.20  ?  Types: Cigarettes  ? Smokeless tobacco: Never  ?Vaping Use  ? Vaping Use: Never used   ?Substance Use Topics  ? Alcohol use: No  ? Drug use: No  ? ? ? ?Allergies   ?Patient has no known allergies. ? ? ?Review of Systems ?Review of Systems  ?Musculoskeletal:  Positive for arthralgias, gait problem and joint swelling.  ?Skin:  Negative for color change and wound.  ?Neurological:  Positive for numbness. Negative for weakness.  ? ? ?Physical Exam ?Triage Vital Signs ?ED Triage Vitals  ?Enc Vitals Group  ?   BP   ?   Pulse   ?   Resp   ?   Temp   ?   Temp src   ?   SpO2   ?   Weight   ?   Height   ?   Head Circumference   ?   Peak Flow   ?   Pain Score   ?   Pain Loc   ?   Pain Edu?   ?   Excl. in  GC?   ? ?No data found. ? ?Updated Vital Signs ?BP 133/80 (BP Location: Left Arm)   Pulse 77   Temp 98.4 ?F (36.9 ?C) (Oral)   Resp 18   Ht 5' 4.5" (1.638 m)   Wt (!) 328 lb 0.7 oz (148.8 kg)   SpO2 97%   BMI 55.44 kg/m?  ?   ? ?Physical Exam ?Vitals and nursing note reviewed.  ?Constitutional:   ?   General: She is not in acute distress. ?   Appearance: Normal appearance. She is obese. She is not ill-appearing or toxic-appearing.  ?HENT:  ?   Head: Normocephalic and atraumatic.  ?Eyes:  ?   General: No scleral icterus.    ?   Right eye: No discharge.     ?   Left eye: No discharge.  ?   Conjunctiva/sclera: Conjunctivae normal.  ?Cardiovascular:  ?   Rate and Rhythm: Normal rate and regular rhythm.  ?   Pulses: Normal pulses.  ?Pulmonary:  ?   Effort: Pulmonary effort is normal. No respiratory distress.  ?Musculoskeletal:  ?   Cervical back: Neck supple.  ?   Right ankle: Swelling (moderate swelling lateral ankle and foot) present. Tenderness present over the lateral malleolus, medial malleolus and ATF ligament. Decreased range of motion.  ?   Right foot: Decreased range of motion. Swelling and tenderness (bases of 3rd and 4th metatarsals) present.  ?Skin: ?   General: Skin is dry.  ?Neurological:  ?   General: No focal deficit present.  ?   Mental Status: She is alert. Mental status is at baseline.  ?    Motor: No weakness.  ?   Gait: Gait abnormal.  ?Psychiatric:     ?   Mood and Affect: Mood normal.     ?   Behavior: Behavior normal.     ?   Thought Content: Thought content normal.  ? ? ? ?UC Treatments / Results  ?Labs ?(all labs ordered are listed, but only abnormal results are displayed) ?Labs Reviewed - No data to display ? ?EKG ? ? ?Radiology ?DG Ankle Complete Right ? ?Result Date: 10/27/2021 ?CLINICAL DATA:  Larey SeatFell in hole yesterday. Right ankle pain and swelling. EXAM: RIGHT ANKLE - COMPLETE 3+ VIEW COMPARISON:  None. FINDINGS: There is no evidence of fracture, dislocation, or joint effusion. There is no evidence of arthropathy or other focal bone abnormality. Soft tissue swelling is seen overlying the lateral malleolus. IMPRESSION: Lateral soft tissue swelling. No evidence of fracture or dislocation. Electronically Signed   By: Danae OrleansJohn A Stahl M.D.   On: 10/27/2021 09:34  ? ?DG Foot Complete Right ? ?Result Date: 10/27/2021 ?CLINICAL DATA:  Stepped in hole yesterday.  Lateral midfoot pain. EXAM: RIGHT FOOT COMPLETE - 3+ VIEW COMPARISON:  None. FINDINGS: There is no evidence of acute fracture or dislocation. Old fracture deformity of the distal 4th metatarsal noted. There is no evidence of arthropathy or other focal bone abnormality. Soft tissues are unremarkable. IMPRESSION: No acute findings. Electronically Signed   By: Danae OrleansJohn A Stahl M.D.   On: 10/27/2021 09:36   ? ?Procedures ?Procedures (including critical care time) ? ?Medications Ordered in UC ?Medications - No data to display ? ?Initial Impression / Assessment and Plan / UC Course  ?I have reviewed the triage vital signs and the nursing notes. ? ?Pertinent labs & imaging results that were available during my care of the patient were reviewed by me and considered in my medical decision making (see chart for  details). ? ?31 year old female presenting for right ankle and foot pain and swelling secondary to a fall sustained yesterday.  Also reports unable to bear  weight due to pain.  On exam she does have moderate swelling of the lateral ankle and foot.  Tenderness palpation of the medial and lateral malleolus, ATFL and base of third and fourth metatarsals and talus.  Reduced range of motion.  Good strength.  Normal pulses.  X-ray of right foot and ankle obtained.  Reviewed by me.  X-ray shows soft tissue swelling but no evidence of fracture or dislocation.  Discussed with patient.  Reviewed that she has an ankle sprain.  Reviewed RICE guidelines and sent ibuprofen to pharmacy.  Advised she can also take Tylenol.  Patient placed in ankle brace and given crutches.  Reviewed following up with [facility] if not improving over the next week or if worsening symptoms as she may need further imaging. ? ? ?Final Clinical Impressions(s) / UC Diagnoses  ? ?Final diagnoses:  ?Sprain of right ankle, unspecified ligament, initial encounter  ?Acute right ankle pain  ? ? ? ?Discharge Instructions   ? ?  ?SPRAIN: X-rays do not show any evidence of fracture.  Stressed avoiding painful activities . Reviewed RICE guidelines. Use medications as directed, including NSAIDs. If no NSAIDs have been prescribed for you today, you may take Aleve or Motrin over the counter. May use Tylenol in between doses of NSAIDs.  If no improvement in the next 1-2 weeks, f/u with PCP or return to our office for reexamination, and please feel free to call or return at any time for any questions or concerns you may have and we will be happy to help you!     ? ? ? ? ?ED Prescriptions   ? ? Medication Sig Dispense Auth. Provider  ? ibuprofen (ADVIL) 800 MG tablet Take 1 tablet (800 mg total) by mouth every 8 (eight) hours as needed. 21 tablet Shirlee Latch, PA-C  ? ?  ? ?I have reviewed the PDMP during this encounter. ?  ?Shirlee Latch, PA-C ?10/27/21 2841 ? ?

## 2021-10-27 NOTE — ED Triage Notes (Signed)
Pt c/o right ankle pain. Started yesterday. She states she stepped in a hole in the yard and turned her ankle. She states it swelled instantly and is unable to bare weight.  ?

## 2022-04-22 ENCOUNTER — Encounter: Payer: Self-pay | Admitting: Emergency Medicine

## 2022-04-22 ENCOUNTER — Ambulatory Visit
Admission: EM | Admit: 2022-04-22 | Discharge: 2022-04-22 | Disposition: A | Discharge status: Home / Self Care | Payer: Medicaid Other

## 2022-04-22 DIAGNOSIS — H6503 Acute serous otitis media, bilateral: Secondary | ICD-10-CM | POA: Diagnosis not present

## 2022-04-22 MED ORDER — AMOXICILLIN 500 MG PO CAPS: 500.0000 mg | ORAL_CAPSULE | Freq: Two times a day (BID) | ORAL | 0 refills | Status: AC

## 2022-04-22 NOTE — ED Triage Notes (Signed)
Patient c/o sinus pressure, HAs, nasal congestion for 2 weeks.  Patient reports left ear pain that started 2-3 days ago. Patient denies fevers.

## 2022-04-22 NOTE — ED Provider Notes (Signed)
[facility]    CSN: [id number] Arrival date & time: 04/22/22  1009      History   Chief Complaint Chief Complaint  Patient presents with   Nasal Congestion   Sinus Problem   Otalgia    HPI Bonnie Rasmussen is a 31 y.o. female.   Patient presents with nasal congestion, rhinorrhea and a nonproductive cough for 14 days, began to have left-sided ear pain 4 days ago.  Ear pain is worsening.  Tolerating food and liquids.  No known sick contacts.  Has not attempted treatment of symptoms.  No pertinent medical history.  Denies shortness of breath wheezing, fever, chills or body aches.    Past Medical History:  Diagnosis Date   Drug abuse [facility])    Obese     Patient Active Problem List   Diagnosis Date Noted   Decreased fetal movement 11/01/2019   History of suicide attempt 06/28/2017    Past Surgical History:  Procedure Laterality Date   CESAREAN SECTION      OB History     Gravida  2   Para  1   Term  1   Preterm      AB      Living  1      SAB      IAB      Ectopic      Multiple      Live Births  1            Home Medications    Prior to Admission medications   Medication Sig Start Date End Date Taking? Authorizing Provider  etonogestrel (NEXPLANON) 68 MG IMPL implant 1 each by Subdermal route once.   Yes [provider]  ibuprofen (ADVIL) 800 MG tablet Take 1 tablet (800 mg total) by mouth every 8 (eight) hours as needed. 10/27/21   Danton Clap, PA-C    Family History Family History  Problem Relation Age of Onset   Congestive Heart Failure Mother    Diabetes Mother    Anemia Mother    Heart disease Father    Stroke Father    Diabetes Maternal Grandmother    Asthma Half-Brother     Social History Social History   Tobacco Use   Smoking status: Every Day    Packs/day: 0.20    Types: Cigarettes   Smokeless tobacco: Never  Vaping Use   Vaping Use: Never used  Substance Use Topics   Alcohol use: No   Drug  use: No     Allergies   Patient has no known allergies.   Review of Systems Review of Systems  Constitutional: Negative.   HENT:  Positive for congestion, ear pain and rhinorrhea. Negative for dental problem, drooling, ear discharge, facial swelling, hearing loss, mouth sores, nosebleeds, postnasal drip, sinus pressure, sinus pain, sneezing, sore throat, tinnitus, trouble swallowing and voice change.   Respiratory:  Positive for cough. Negative for apnea, choking, chest tightness, shortness of breath, wheezing and stridor.   Cardiovascular: Negative.   Gastrointestinal: Negative.   Skin: Negative.   Neurological: Negative.      Physical Exam Triage Vital Signs ED Triage Vitals  Enc Vitals Group     BP 04/22/22 1035 113/63     Pulse Rate 04/22/22 1035 68     Resp 04/22/22 1035 14     Temp 04/22/22 1035 98.2 F (36.8 C)     Temp Source 04/22/22 1035 Oral     SpO2 04/22/22 1035 96 %  Weight 04/22/22 1032 (!) 320 lb (145.2 kg)     Height 04/22/22 1032 5\' 5"  (1.651 m)     Head Circumference --      Peak Flow --      Pain Score 04/22/22 1032 6     Pain Loc --      Pain Edu? --      Excl. in GC? --    No data found.  Updated Vital Signs BP 113/63 (BP Location: Right Arm)   Pulse 68   Temp 98.2 F (36.8 C) (Oral)   Resp 14   Ht 5\' 5"  (1.651 m)   Wt (!) 320 lb (145.2 kg)   SpO2 96%   BMI 53.25 kg/m   Visual Acuity Right Eye Distance:   Left Eye Distance:   Bilateral Distance:    Right Eye Near:   Left Eye Near:    Bilateral Near:     Physical Exam Constitutional:      Appearance: Normal appearance.  HENT:     Head: Normocephalic.     Right Ear: Ear canal and external ear normal. Tympanic membrane is erythematous.     Left Ear: Ear canal and external ear normal. Tympanic membrane is erythematous.     Nose: Congestion and rhinorrhea present.     Mouth/Throat:     Mouth: Mucous membranes are moist.     Pharynx: Posterior oropharyngeal erythema present.      Tonsils: No tonsillar exudate. 0 on the right. 0 on the left.  Eyes:     Extraocular Movements: Extraocular movements intact.  Cardiovascular:     Rate and Rhythm: Normal rate and regular rhythm.     Pulses: Normal pulses.     Heart sounds: Normal heart sounds.  Pulmonary:     Effort: Pulmonary effort is normal.     Breath sounds: Normal breath sounds.  Skin:    General: Skin is warm and dry.  Neurological:     Mental Status: She is alert and oriented to person, place, and time. Mental status is at baseline.  Psychiatric:        Mood and Affect: Mood normal.        Behavior: Behavior normal.      UC Treatments / Results  Labs (all labs ordered are listed, but only abnormal results are displayed) Labs Reviewed - No data to display  EKG   Radiology No results found.  Procedures Procedures (including critical care time)  Medications Ordered in UC Medications - No data to display  Initial Impression / Assessment and Plan / UC Course  I have reviewed the triage vital signs and the nursing notes.  Pertinent labs & imaging results that were available during my care of the patient were reviewed by me and considered in my medical decision making (see chart for details).  Nonrecurrent acute serous otitis media of both ears  Erythema is noted to the bilateral tympanic membranes without swelling or drainage present, discussed with patient, erythema and tonsillar adenopathy is present to the oropharynx without exudate and there is congestion within the nasal turbinates, amoxicillin prescribed, discussed administration, recommended over-the-counter analgesics, warm compresses to the external ear and or additional over-the-counter medications for remaining symptoms, advised against any ear cleaning until symptoms have resolved and treatment is complete, given strict precautions to follow-up with urgent care if symptoms persist or worsen Final Clinical Impressions(s) / UC Diagnoses    Final diagnoses:  None   Discharge Instructions   None  ED Prescriptions   None    PDMP not reviewed this encounter.   Valinda Hoar, NP 04/22/22 1110

## 2022-04-22 NOTE — Discharge Instructions (Signed)
Today you are being treated for an infection of the eardrum, on your exam there is redness of both eardrums, your throat is red and your tonsils are enlarged and there is congestion within the nose  Take amoxicillin twice daily for 10 days, you should begin to see improvement after 48 hours of medication use and then it should progressively get better  You may use Tylenol or ibuprofen for management of discomfort  May hold warm compresses to the ear for additional comfort  Please not attempted any ear cleaning or object or fluid placement into the ear canal to prevent further irritation   Please follow-up for reevaluation if your symptoms do not resolve

## 2022-07-28 ENCOUNTER — Encounter: Payer: Self-pay | Admitting: Emergency Medicine

## 2022-07-28 ENCOUNTER — Ambulatory Visit (INDEPENDENT_AMBULATORY_CARE_PROVIDER_SITE_OTHER): Payer: Medicaid Other

## 2022-07-28 ENCOUNTER — Ambulatory Visit
Admission: EM | Admit: 2022-07-28 | Discharge: 2022-07-28 | Disposition: A | Discharge status: Home / Self Care | Payer: Medicaid Other | Attending: Family Medicine | Admitting: Family Medicine

## 2022-07-28 DIAGNOSIS — R079 Chest pain, unspecified: Secondary | ICD-10-CM | POA: Diagnosis not present

## 2022-07-28 DIAGNOSIS — M546 Pain in thoracic spine: Secondary | ICD-10-CM

## 2022-07-28 DIAGNOSIS — S29019A Strain of muscle and tendon of unspecified wall of thorax, initial encounter: Secondary | ICD-10-CM

## 2022-07-28 DIAGNOSIS — W19XXXA Unspecified fall, initial encounter: Secondary | ICD-10-CM

## 2022-07-28 DIAGNOSIS — R0789 Other chest pain: Secondary | ICD-10-CM

## 2022-07-28 MED ORDER — NAPROXEN 500 MG PO TABS: 500.0000 mg | ORAL_TABLET | Freq: Two times a day (BID) | ORAL | 0 refills | Status: AC

## 2022-07-28 MED ORDER — KETOROLAC TROMETHAMINE 60 MG/2ML IM SOLN
30.0000 mg | Freq: Once | INTRAMUSCULAR | Status: AC
  Administered 2022-07-28: 30 mg via INTRAMUSCULAR

## 2022-07-28 MED ORDER — CYCLOBENZAPRINE HCL 10 MG PO TABS: 10.0000 mg | ORAL_TABLET | Freq: Three times a day (TID) | ORAL | 0 refills | Status: AC | PRN

## 2022-07-28 NOTE — ED Provider Notes (Signed)
[facility]    CSN: [id number] Arrival date & time: 07/28/22  8099      History   Chief Complaint Chief Complaint  Patient presents with   Back Pain    HPI  HPI Bonnie Rasmussen is a 32 y.o. female.   Bonnie Rasmussen presents after a fall on Monday while horseplaying with her brother.  She has been having chest pain that radiates to her back.  Having difficulty breathing and shortness of breath with the sharp pains.  No previously similar symptoms.  Pain rated 10/10.  Chest pain is central and radiates under both breasts.  It hurts to touch.    States when she fell she first hit her side then landed on her back. Deniesleg weakness, new numbness, new tingling, abdominal pain, incontinence, perianal numbness. Continues to have sharp description pain with movement. Bonnie Rasmussen does not feel like her legs are weak.   Has never injured her back before.   Has tried Tylenol and NSAID with minimal.       Past Medical History:  Diagnosis Date   Drug abuse [facility])    Obese     Patient Active Problem List   Diagnosis Date Noted   Decreased fetal movement 11/01/2019   History of suicide attempt 06/28/2017    Past Surgical History:  Procedure Laterality Date   CESAREAN SECTION      OB History     Gravida  2   Para  1   Term  1   Preterm      AB      Living  1      SAB      IAB      Ectopic      Multiple      Live Births  1            Home Medications    Prior to Admission medications   Medication Sig Start Date End Date Taking? Authorizing Provider  cyclobenzaprine (FLEXERIL) 10 MG tablet Take 1 tablet (10 mg total) by mouth 3 (three) times daily as needed for muscle spasms. 07/28/22  Yes Anav Lammert, DO  etonogestrel (NEXPLANON) 68 MG IMPL implant 1 each by Subdermal route once.   Yes [provider]  naproxen (NAPROSYN) 500 MG tablet Take 1 tablet (500 mg total) by mouth 2 (two) times daily with a meal. 07/28/22  Yes Kamir Selover, Seward Meth, DO     Family History Family History  Problem Relation Age of Onset   Congestive Heart Failure Mother    Diabetes Mother    Anemia Mother    Heart disease Father    Stroke Father    Diabetes Maternal Grandmother    Asthma Half-Brother     Social History Social History   Tobacco Use   Smoking status: Every Day    Packs/day: 0.20    Types: Cigarettes   Smokeless tobacco: Never  Vaping Use   Vaping Use: Never used  Substance Use Topics   Alcohol use: No   Drug use: No     Allergies   Patient has no known allergies.   Review of Systems Review of Systems: egative unless otherwise stated in HPI.      Physical Exam Triage Vital Signs ED Triage Vitals  Enc Vitals Group     BP      Pulse      Resp      Temp      Temp src      SpO2  Weight      Height      Head Circumference      Peak Flow      Pain Score      Pain Loc      Pain Edu?      Excl. in GC?    No data found.  Updated Vital Signs BP (!) 147/98 (BP Location: Left Wrist)   Pulse 93   Temp 98.4 F (36.9 C) (Oral)   Resp 18   SpO2 99%   Visual Acuity Right Eye Distance:   Left Eye Distance:   Bilateral Distance:    Right Eye Near:   Left Eye Near:    Bilateral Near:     Physical Exam GEN: uncomfortable appearing obese female in no acute distress  CVS: well perfused, RRR, no murmur CHEST WALL: Anterior peristernal tenderness to palpation NECK: no midline C spine tenderness,  RESP: speaking in full sentences without pause, no respiratory distress, difficult to auscultate due to body habitus but clear  MSK:  Thoracic and lumbar spine: - Inspection: no gross deformity or asymmetry, swelling or ecchymosis. No new skin changes  - Palpation: TTP over the mid to lower thoracic spinous processes paraspinal tenderness, no midline lumbar spinous process tenderness or lumbar paraspinal muscles, - ROM: Limited rotation, flexion and extension of spine due to body habitus and acute pain -  Strength: 5/5 strength of lower extremity in L4-S1 nerve root distributions b/l - Neuro: sensation intact in the L4-S1 nerve root distribution b/l, - Special testing: Negative straight leg raise SKIN: warm, dry   UC Treatments / Results  Labs (all labs ordered are listed, but only abnormal results are displayed) Labs Reviewed - No data to display  EKG   Radiology DG Ribs Unilateral Right  Result Date: 07/28/2022 CLINICAL DATA:  Patient states she fell 4 days ago enhanced sharp pain in her back making it difficult to breathe. EXAM: RIGHT RIBS - 2 VIEW COMPARISON:  None Available. FINDINGS: No fracture or other bone lesions are seen involving the ribs. IMPRESSION: Negative. Electronically Signed   By: Larose Hires D.O.   On: 07/28/2022 10:29   DG Chest 2 View  Result Date: 07/28/2022 CLINICAL DATA:  Fall, pain EXAM: CHEST - 2 VIEW COMPARISON:  12/08/2011 FINDINGS: Unchanged cardiomediastinal silhouette. No focal airspace consolidation. No pleural effusion or evidence of pneumothorax. No definite acute osseous abnormality on frontal and lateral views of the chest. Note that the right eleventh rib and T11 and T12 abnormalities are best visualized on separately dictated thoracic spine radiographs. IMPRESSION: No evidence of acute cardiopulmonary disease. Electronically Signed   By: Caprice Renshaw M.D.   On: 07/28/2022 09:30   DG Thoracic Spine 2 View  Result Date: 07/28/2022 CLINICAL DATA:  Fall, pain EXAM: THORACIC SPINE 2 VIEWS COMPARISON:  CT 12/08/2011 FINDINGS: There is no evidence of thoracic spine fracture. Slight levoconvex curvature. No significant degenerative change. There is a linear lucency longitudinally oriented along the right eleventh rib, partially visualized. There is mild anterior wedging of T11 and T12. IMPRESSION: Longitudinally oriented linear lucency along the right eleventh rib, could represent nondisplaced fracture or artifact. Correlate with point tenderness and consider  dedicated rib radiographs. Mild anterior wedging of T11 and T12, which could be physiologic or mild age-indeterminate compression deformities. Electronically Signed   By: Caprice Renshaw M.D.   On: 07/28/2022 09:28    Procedures Procedures (including critical care time)  Medications Ordered in UC Medications  ketorolac (TORADOL) injection 30  mg (30 mg Intramuscular Given 07/28/22 1105)    Initial Impression / Assessment and Plan / UC Course  I have reviewed the triage vital signs and the nursing notes.  Pertinent labs & imaging results that were available during my care of the patient were reviewed by me and considered in my medical decision making (see chart for details).      Pt is a 32 y.o.  female presents after fall 4 days  with mid back and chest pain.  On chart review, she had a CT chest in May 2013 that showed a 3 mm right lower lobe pleural and pulmonary nodule.  No spinal imaging available for review.    EKG without acute ST or T wave  changes. NSR, personally reviewed by me.   She has chest wall tenderness and mid line thoracic tenderness. Obtained thoracic and chest plain films.  Radiologist noted possible elevation of the right 11th rib.  Right rib series obtained and did not show any acute rib fracture.  Chest x-ray without pleural effusion, pneumothorax or focal pneumonia.  There was mild anterior wedging of the T11 and T12 which could be an age-indeterminate compression fracture or physiologic according to the radiologist.  Patient updated with these findings.    Patient to gradually return to normal activities, as tolerated and continue ordinary activities within the limits permitted by pain.  Request shot for pain here.  Advised Toradol and patient agreeable.  30 mg IM Toradol given.  Prescribed Naproxen sodium  and muscle relaxer  for pain relief.    Advised patient to avoid other NSAIDs while taking Naprosyn. Tylenol and Lidocaine patches PRN for multimodal pain relief. Counseled  patient on red flag symptoms and when to seek immediate care.  No red flags suggesting cauda equina syndrome or progressive major motor weakness. Patient to follow up with orthopedic provider if symptoms do not improve with conservative treatment.  Return and ED precautions given.    Discussed MDM, treatment plan and plan for follow-up with patient/parent who agrees with plan.   Final Clinical Impressions(s) / UC Diagnoses   Final diagnoses:  Thoracic myofascial strain, initial encounter  Atypical chest pain  Fall, initial encounter     Discharge Instructions      Your mid spine xrays showed a possible compression fracture of T11 and T12 but this could also be normal for you. No rib fracture seen.   Your lungs were clear.  The electrical tracing of your heart was normal.    If medication was prescribed, stop by the pharmacy to pick up your prescriptions.  For your  pain, Take 1000 mg Tylenol and muscle relaxer (Flexeril/cyclobenzaprine) 2-3 times a day  with Naprosyn twice a day,  as needed for pain.  Apply warm compresses intermittently, as needed.  As pain recedes, begin normal activities slowly as tolerated.  Follow up with primary care provider or an orthopedic provider, if symptoms persist.  Watch for worsening symptoms such as an increasing weakness or loss of sensation, increasing pain and/or the loss of bladder or bowel function. Should any of these occur, go to the emergency department immediately.          ED Prescriptions     Medication Sig Dispense Auth. Provider   naproxen (NAPROSYN) 500 MG tablet Take 1 tablet (500 mg total) by mouth 2 (two) times daily with a meal. 30 tablet Jazper Nikolai, DO   cyclobenzaprine (FLEXERIL) 10 MG tablet Take 1 tablet (10 mg total) by mouth  3 (three) times daily as needed for muscle spasms. 30 tablet Staci Dack, Ronnette Juniper, DO      I have reviewed the PDMP during this encounter.   Lyndee Hensen, DO 07/28/22 1124

## 2022-07-28 NOTE — ED Triage Notes (Signed)
Pt states she fell 4 days ago and has sharp pain in her back making a difficult to take a deep breath in.

## 2022-07-28 NOTE — Discharge Instructions (Addendum)
Your mid spine xrays showed a possible compression fracture of T11 and T12 but this could also be normal for you. No rib fracture seen.   Your lungs were clear.  The electrical tracing of your heart was normal.    If medication was prescribed, stop by the pharmacy to pick up your prescriptions.  For your  pain, Take 1000 mg Tylenol and muscle relaxer (Flexeril/cyclobenzaprine) 2-3 times a day  with Naprosyn twice a day,  as needed for pain.  Apply warm compresses intermittently, as needed.  As pain recedes, begin normal activities slowly as tolerated.  Follow up with primary care provider or an orthopedic provider, if symptoms persist.  Watch for worsening symptoms such as an increasing weakness or loss of sensation, increasing pain and/or the loss of bladder or bowel function. Should any of these occur, go to the emergency department immediately.

## 2023-11-05 IMAGING — CR DG ANKLE COMPLETE 3+V*R*
3 series · 3 of 3 positions shown · non-contrast
Comparison: None.

CLINICAL DATA: Fell in hole yesterday. Right ankle pain and
swelling.

EXAM:
RIGHT ANKLE - COMPLETE 3+ VIEW

[ankle ap]
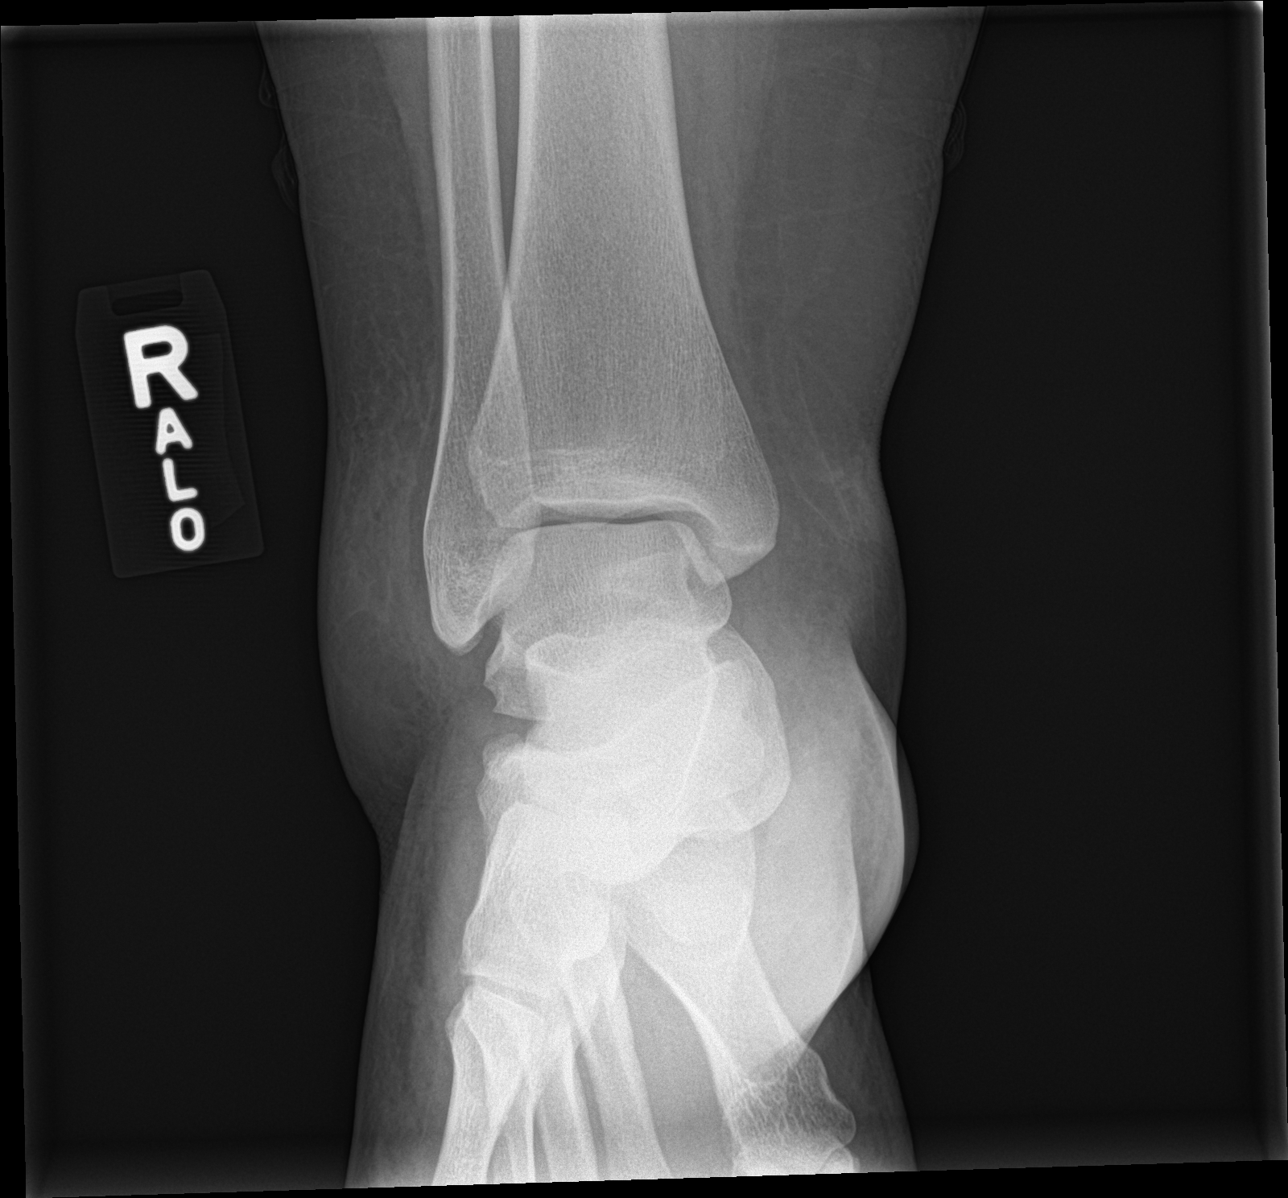

[ankle obl]
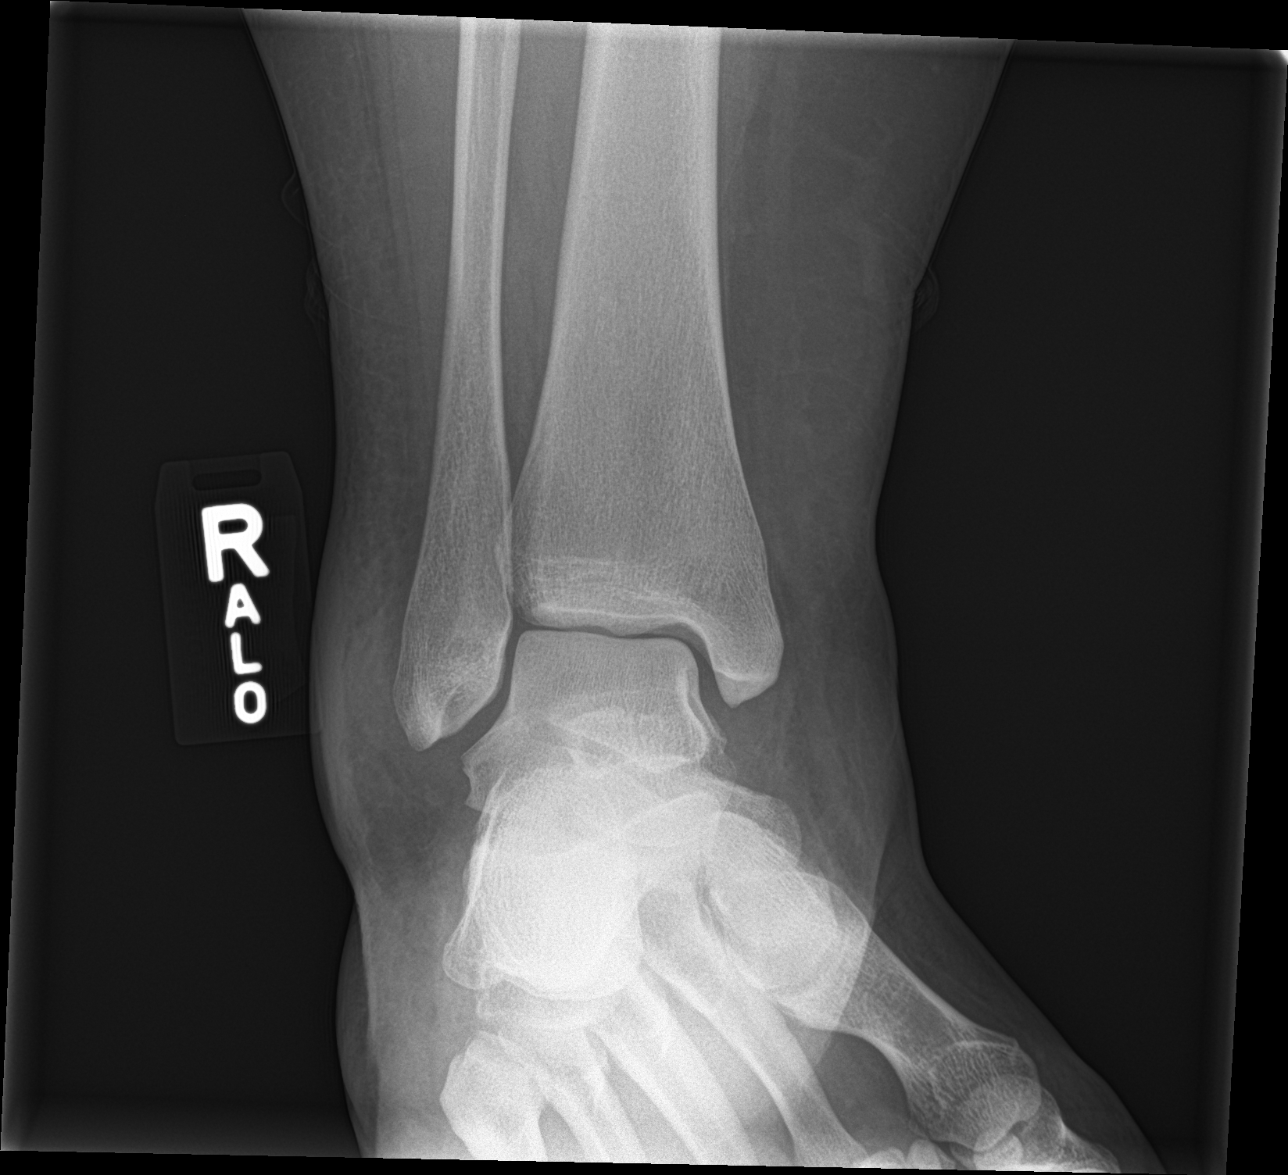

[ankle lat]
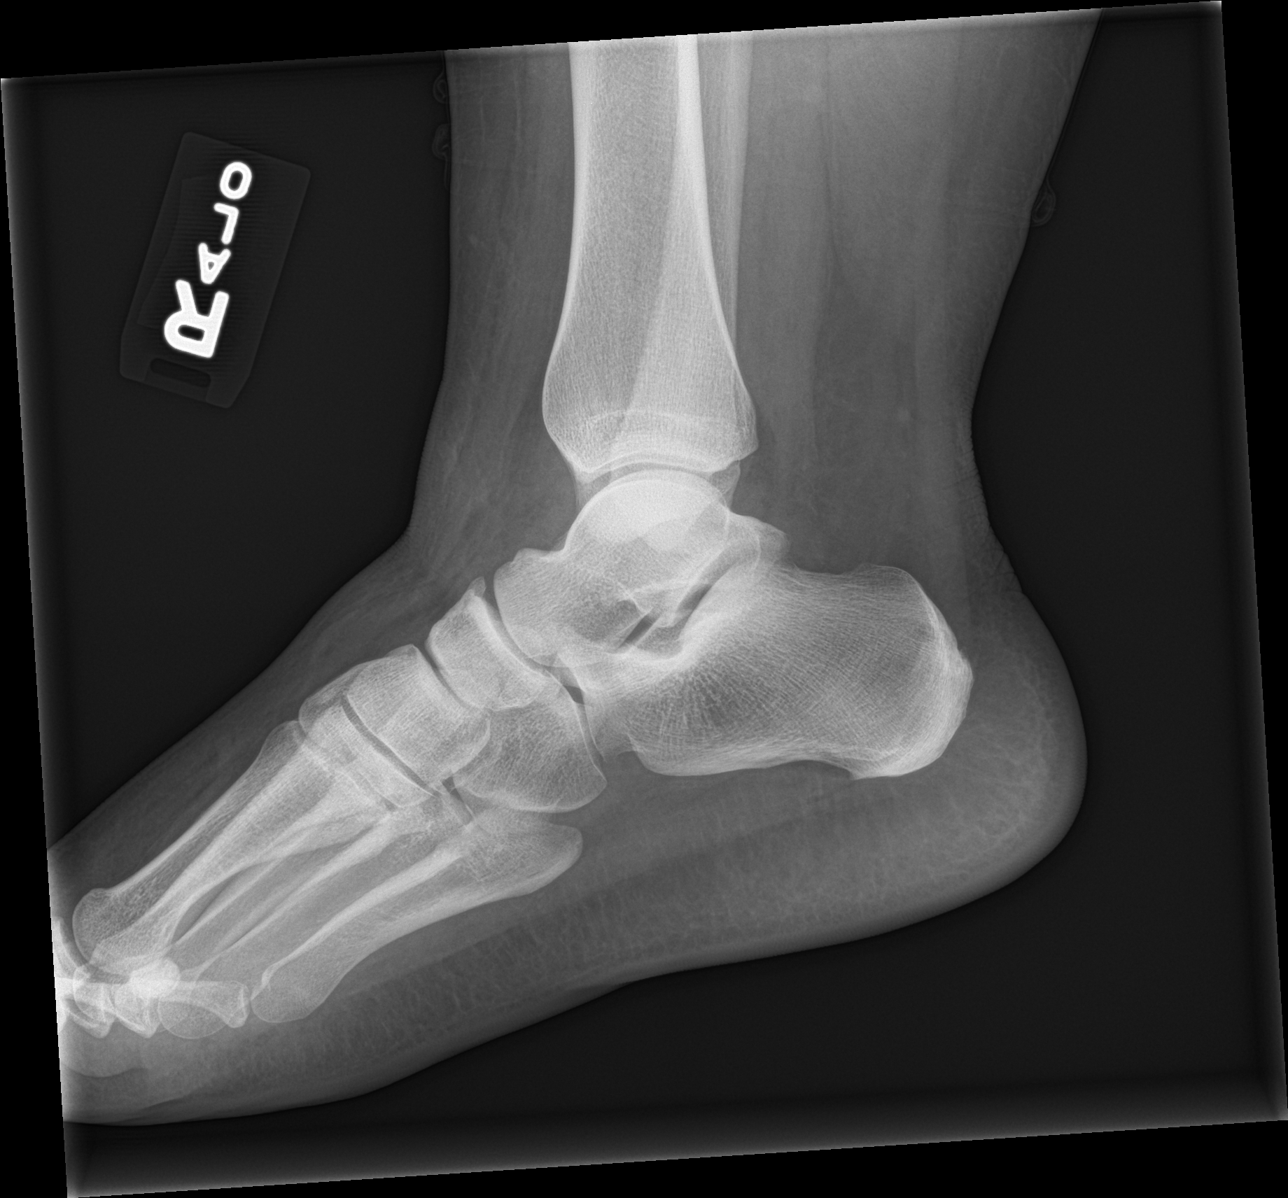

[3 of 3 positions shown; findings below may reference images not displayed]

FINDINGS: There is no evidence of fracture, dislocation, or joint effusion.
There is no evidence of arthropathy or other focal bone abnormality.
Soft tissue swelling is seen overlying the lateral malleolus.
IMPRESSION: Lateral soft tissue swelling. No evidence of fracture or
dislocation.
